# Patient Record
Sex: Female | Born: 1975 | Race: White | Hispanic: No | Marital: Married | Smoking: Current every day smoker
Health system: Southern US, Community
[De-identification: ages and names within clinical notes are randomized; demographics above are authoritative.]

## PROBLEM LIST (undated history)

## (undated) DIAGNOSIS — J449 Chronic obstructive pulmonary disease, unspecified: Secondary | ICD-10-CM

## (undated) DIAGNOSIS — K219 Gastro-esophageal reflux disease without esophagitis: Secondary | ICD-10-CM

---

## 1997-04-25 ENCOUNTER — Other Ambulatory Visit: Admission: RE | Admit: 1997-04-25 | Discharge: 1997-04-25 | Payer: Self-pay | Admitting: Obstetrics and Gynecology

## 1997-05-11 ENCOUNTER — Inpatient Hospital Stay (HOSPITAL_COMMUNITY): Admission: RE | Admit: 1997-05-11 | Discharge: 1997-05-13 | Payer: Self-pay | Admitting: Orthopedic Surgery

## 1997-05-18 ENCOUNTER — Ambulatory Visit (HOSPITAL_COMMUNITY): Admission: RE | Admit: 1997-05-18 | Discharge: 1997-05-18 | Payer: Self-pay | Admitting: Gastroenterology

## 1997-06-12 ENCOUNTER — Other Ambulatory Visit: Admission: RE | Admit: 1997-06-12 | Discharge: 1997-06-12 | Payer: Self-pay | Admitting: Orthopedic Surgery

## 1997-06-15 ENCOUNTER — Other Ambulatory Visit: Admission: RE | Admit: 1997-06-15 | Discharge: 1997-06-15 | Payer: Self-pay | Admitting: Orthopedic Surgery

## 1997-07-25 ENCOUNTER — Emergency Department (HOSPITAL_COMMUNITY): Admission: EM | Admit: 1997-07-25 | Discharge: 1997-07-25 | Payer: Self-pay | Admitting: Emergency Medicine

## 1997-08-12 ENCOUNTER — Ambulatory Visit (HOSPITAL_COMMUNITY): Admission: RE | Admit: 1997-08-12 | Discharge: 1997-08-12 | Payer: Self-pay | Admitting: Orthopedic Surgery

## 1997-08-17 ENCOUNTER — Encounter: Admission: RE | Admit: 1997-08-17 | Discharge: 1997-11-15 | Payer: Self-pay | Admitting: Anesthesiology

## 1997-11-07 ENCOUNTER — Encounter: Payer: Self-pay | Admitting: Orthopedic Surgery

## 1997-11-07 ENCOUNTER — Inpatient Hospital Stay (HOSPITAL_COMMUNITY): Admission: RE | Admit: 1997-11-07 | Discharge: 1997-11-08 | Payer: Self-pay | Admitting: Orthopedic Surgery

## 1998-02-07 ENCOUNTER — Ambulatory Visit (HOSPITAL_BASED_OUTPATIENT_CLINIC_OR_DEPARTMENT_OTHER): Admission: RE | Admit: 1998-02-07 | Discharge: 1998-02-07 | Payer: Self-pay | Admitting: Orthopedic Surgery

## 1998-11-21 ENCOUNTER — Inpatient Hospital Stay (HOSPITAL_COMMUNITY): Admission: RE | Admit: 1998-11-21 | Discharge: 1998-11-25 | Payer: Self-pay | Admitting: Orthopedic Surgery

## 1998-11-21 ENCOUNTER — Encounter: Payer: Self-pay | Admitting: Orthopedic Surgery

## 1999-04-22 ENCOUNTER — Encounter: Payer: Self-pay | Admitting: Orthopedic Surgery

## 1999-04-22 ENCOUNTER — Ambulatory Visit (HOSPITAL_COMMUNITY): Admission: RE | Admit: 1999-04-22 | Discharge: 1999-04-22 | Payer: Self-pay | Admitting: Orthopedic Surgery

## 1999-10-15 ENCOUNTER — Inpatient Hospital Stay (HOSPITAL_COMMUNITY): Admission: RE | Admit: 1999-10-15 | Discharge: 1999-10-18 | Payer: Self-pay | Admitting: Orthopedic Surgery

## 1999-10-15 ENCOUNTER — Encounter: Payer: Self-pay | Admitting: Orthopedic Surgery

## 2000-03-26 ENCOUNTER — Ambulatory Visit (HOSPITAL_COMMUNITY): Admission: RE | Admit: 2000-03-26 | Discharge: 2000-03-26 | Payer: Self-pay | Admitting: Gastroenterology

## 2000-05-18 ENCOUNTER — Ambulatory Visit (HOSPITAL_COMMUNITY): Admission: RE | Admit: 2000-05-18 | Discharge: 2000-05-18 | Payer: Self-pay | Admitting: Gastroenterology

## 2000-07-28 ENCOUNTER — Inpatient Hospital Stay (HOSPITAL_COMMUNITY): Admission: EM | Admit: 2000-07-28 | Discharge: 2000-08-03 | Payer: Self-pay | Admitting: Psychiatry

## 2000-10-17 ENCOUNTER — Encounter: Payer: Self-pay | Admitting: Emergency Medicine

## 2000-10-18 ENCOUNTER — Inpatient Hospital Stay (HOSPITAL_COMMUNITY): Admission: EM | Admit: 2000-10-18 | Discharge: 2000-10-29 | Payer: Self-pay | Admitting: *Deleted

## 2000-11-22 ENCOUNTER — Encounter: Payer: Self-pay | Admitting: Orthopedic Surgery

## 2000-11-25 ENCOUNTER — Inpatient Hospital Stay (HOSPITAL_COMMUNITY): Admission: RE | Admit: 2000-11-25 | Discharge: 2000-11-29 | Payer: Self-pay | Admitting: Orthopedic Surgery

## 2000-11-29 ENCOUNTER — Inpatient Hospital Stay (HOSPITAL_COMMUNITY)
Admission: RE | Admit: 2000-11-29 | Discharge: 2000-12-07 | Payer: Self-pay | Admitting: Physical Medicine & Rehabilitation

## 2001-02-07 ENCOUNTER — Encounter: Payer: Self-pay | Admitting: *Deleted

## 2001-02-07 ENCOUNTER — Inpatient Hospital Stay (HOSPITAL_COMMUNITY): Admission: EM | Admit: 2001-02-07 | Discharge: 2001-02-20 | Payer: Self-pay | Admitting: Psychiatry

## 2001-02-18 ENCOUNTER — Encounter: Payer: Self-pay | Admitting: Psychiatry

## 2001-03-11 ENCOUNTER — Encounter: Payer: Self-pay | Admitting: Emergency Medicine

## 2001-03-11 ENCOUNTER — Emergency Department (HOSPITAL_COMMUNITY): Admission: EM | Admit: 2001-03-11 | Discharge: 2001-03-12 | Payer: Self-pay | Admitting: Emergency Medicine

## 2001-03-12 ENCOUNTER — Encounter: Payer: Self-pay | Admitting: Emergency Medicine

## 2001-03-25 ENCOUNTER — Ambulatory Visit (HOSPITAL_COMMUNITY): Admission: RE | Admit: 2001-03-25 | Discharge: 2001-03-25 | Payer: Self-pay | Admitting: Gastroenterology

## 2001-04-10 ENCOUNTER — Inpatient Hospital Stay (HOSPITAL_COMMUNITY): Admission: EM | Admit: 2001-04-10 | Discharge: 2001-04-26 | Payer: Self-pay | Admitting: Psychiatry

## 2001-04-24 ENCOUNTER — Encounter: Payer: Self-pay | Admitting: Emergency Medicine

## 2001-04-24 ENCOUNTER — Emergency Department (HOSPITAL_COMMUNITY): Admission: EM | Admit: 2001-04-24 | Discharge: 2001-04-25 | Payer: Self-pay | Admitting: Emergency Medicine

## 2001-05-16 ENCOUNTER — Emergency Department (HOSPITAL_COMMUNITY): Admission: EM | Admit: 2001-05-16 | Discharge: 2001-05-17 | Payer: Self-pay | Admitting: *Deleted

## 2001-06-09 ENCOUNTER — Encounter: Payer: Self-pay | Admitting: Physical Medicine & Rehabilitation

## 2001-06-09 ENCOUNTER — Ambulatory Visit (HOSPITAL_COMMUNITY)
Admission: RE | Admit: 2001-06-09 | Discharge: 2001-06-09 | Payer: Self-pay | Admitting: Physical Medicine & Rehabilitation

## 2001-09-05 ENCOUNTER — Encounter
Admission: RE | Admit: 2001-09-05 | Discharge: 2001-09-05 | Payer: Self-pay | Admitting: Physical Medicine & Rehabilitation

## 2001-11-10 ENCOUNTER — Inpatient Hospital Stay (HOSPITAL_COMMUNITY): Admission: EM | Admit: 2001-11-10 | Discharge: 2001-11-21 | Payer: Self-pay | Admitting: Psychiatry

## 2001-11-10 ENCOUNTER — Encounter: Payer: Self-pay | Admitting: Emergency Medicine

## 2001-11-17 ENCOUNTER — Encounter: Payer: Self-pay | Admitting: Psychiatry

## 2001-12-13 ENCOUNTER — Encounter
Admission: RE | Admit: 2001-12-13 | Discharge: 2002-03-13 | Payer: Self-pay | Admitting: Physical Medicine & Rehabilitation

## 2002-04-02 ENCOUNTER — Inpatient Hospital Stay (HOSPITAL_COMMUNITY): Admission: EM | Admit: 2002-04-02 | Discharge: 2002-04-14 | Payer: Self-pay | Admitting: Psychiatry

## 2002-05-22 ENCOUNTER — Encounter
Admission: RE | Admit: 2002-05-22 | Discharge: 2002-08-20 | Payer: Self-pay | Admitting: Physical Medicine & Rehabilitation

## 2002-06-13 ENCOUNTER — Encounter: Payer: Self-pay | Admitting: Physical Medicine & Rehabilitation

## 2002-06-13 ENCOUNTER — Encounter
Admission: RE | Admit: 2002-06-13 | Discharge: 2002-06-13 | Payer: Self-pay | Admitting: Physical Medicine & Rehabilitation

## 2002-06-25 ENCOUNTER — Encounter
Admission: RE | Admit: 2002-06-25 | Discharge: 2002-06-25 | Payer: Self-pay | Admitting: Physical Medicine & Rehabilitation

## 2002-06-25 ENCOUNTER — Encounter: Payer: Self-pay | Admitting: Physical Medicine & Rehabilitation

## 2002-08-14 ENCOUNTER — Emergency Department (HOSPITAL_COMMUNITY): Admission: EM | Admit: 2002-08-14 | Discharge: 2002-08-14 | Payer: Self-pay | Admitting: Emergency Medicine

## 2002-09-07 ENCOUNTER — Ambulatory Visit (HOSPITAL_BASED_OUTPATIENT_CLINIC_OR_DEPARTMENT_OTHER): Admission: RE | Admit: 2002-09-07 | Discharge: 2002-09-08 | Payer: Self-pay | Admitting: Orthopedic Surgery

## 2003-04-16 ENCOUNTER — Inpatient Hospital Stay (HOSPITAL_COMMUNITY): Admission: AD | Admit: 2003-04-16 | Discharge: 2003-04-30 | Payer: Self-pay | Admitting: Psychiatry

## 2003-05-29 ENCOUNTER — Emergency Department (HOSPITAL_COMMUNITY): Admission: EM | Admit: 2003-05-29 | Discharge: 2003-05-29 | Payer: Self-pay | Admitting: Emergency Medicine

## 2003-05-29 ENCOUNTER — Inpatient Hospital Stay (HOSPITAL_COMMUNITY): Admission: AD | Admit: 2003-05-29 | Discharge: 2003-06-06 | Payer: Self-pay | Admitting: Psychiatry

## 2003-08-30 ENCOUNTER — Emergency Department (HOSPITAL_COMMUNITY): Admission: EM | Admit: 2003-08-30 | Discharge: 2003-08-31 | Payer: Self-pay | Admitting: Emergency Medicine

## 2003-09-07 ENCOUNTER — Emergency Department (HOSPITAL_COMMUNITY): Admission: EM | Admit: 2003-09-07 | Discharge: 2003-09-07 | Payer: Self-pay | Admitting: Emergency Medicine

## 2004-01-27 ENCOUNTER — Emergency Department (HOSPITAL_COMMUNITY): Admission: EM | Admit: 2004-01-27 | Discharge: 2004-01-27 | Payer: Self-pay | Admitting: Emergency Medicine

## 2004-07-31 ENCOUNTER — Emergency Department (HOSPITAL_COMMUNITY): Admission: EM | Admit: 2004-07-31 | Discharge: 2004-07-31 | Payer: Self-pay | Admitting: Emergency Medicine

## 2005-07-04 ENCOUNTER — Emergency Department (HOSPITAL_COMMUNITY): Admission: EM | Admit: 2005-07-04 | Discharge: 2005-07-05 | Payer: Self-pay | Admitting: Emergency Medicine

## 2005-08-04 ENCOUNTER — Emergency Department (HOSPITAL_COMMUNITY): Admission: EM | Admit: 2005-08-04 | Discharge: 2005-08-05 | Payer: Self-pay | Admitting: Emergency Medicine

## 2005-08-24 ENCOUNTER — Emergency Department (HOSPITAL_COMMUNITY): Admission: EM | Admit: 2005-08-24 | Discharge: 2005-08-24 | Payer: Self-pay | Admitting: Emergency Medicine

## 2006-02-14 ENCOUNTER — Emergency Department (HOSPITAL_COMMUNITY): Admission: EM | Admit: 2006-02-14 | Discharge: 2006-02-14 | Payer: Self-pay | Admitting: Emergency Medicine

## 2007-01-06 IMAGING — CR DG KNEE COMPLETE 4+V*R*
4 series · 4 of 4 positions shown · non-contrast
Comparison: 07/31/04.

CLINICAL DATA: Stump pain, below the knee amputation. 
 RIGHT KNEE ? 4 VIEW ? 08/04/05:

[view not recorded (1 of 4)]
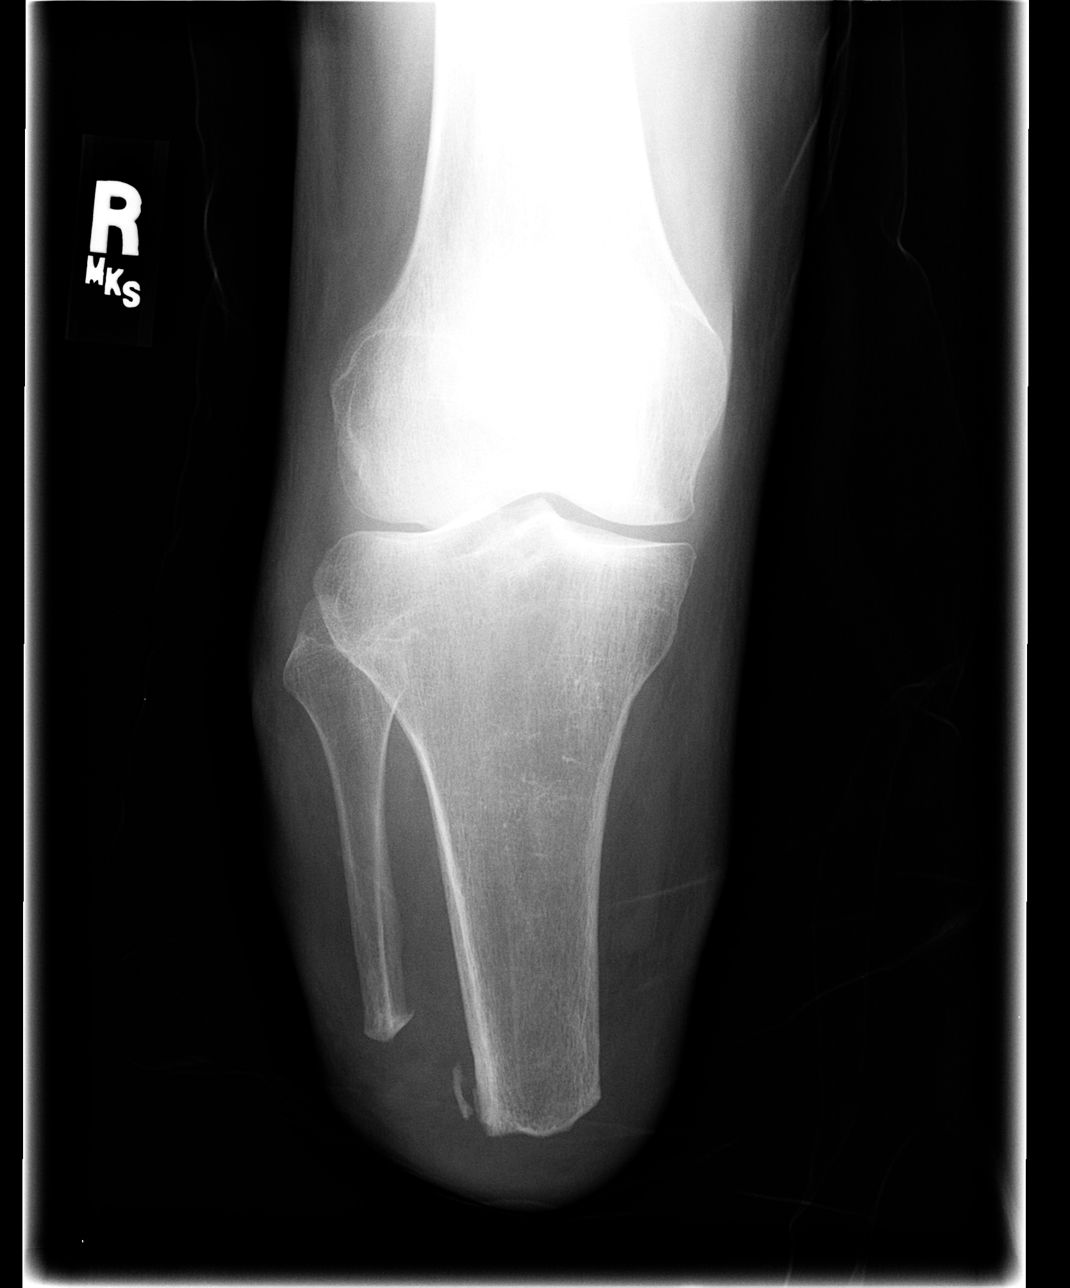

[view not recorded (2 of 4)]
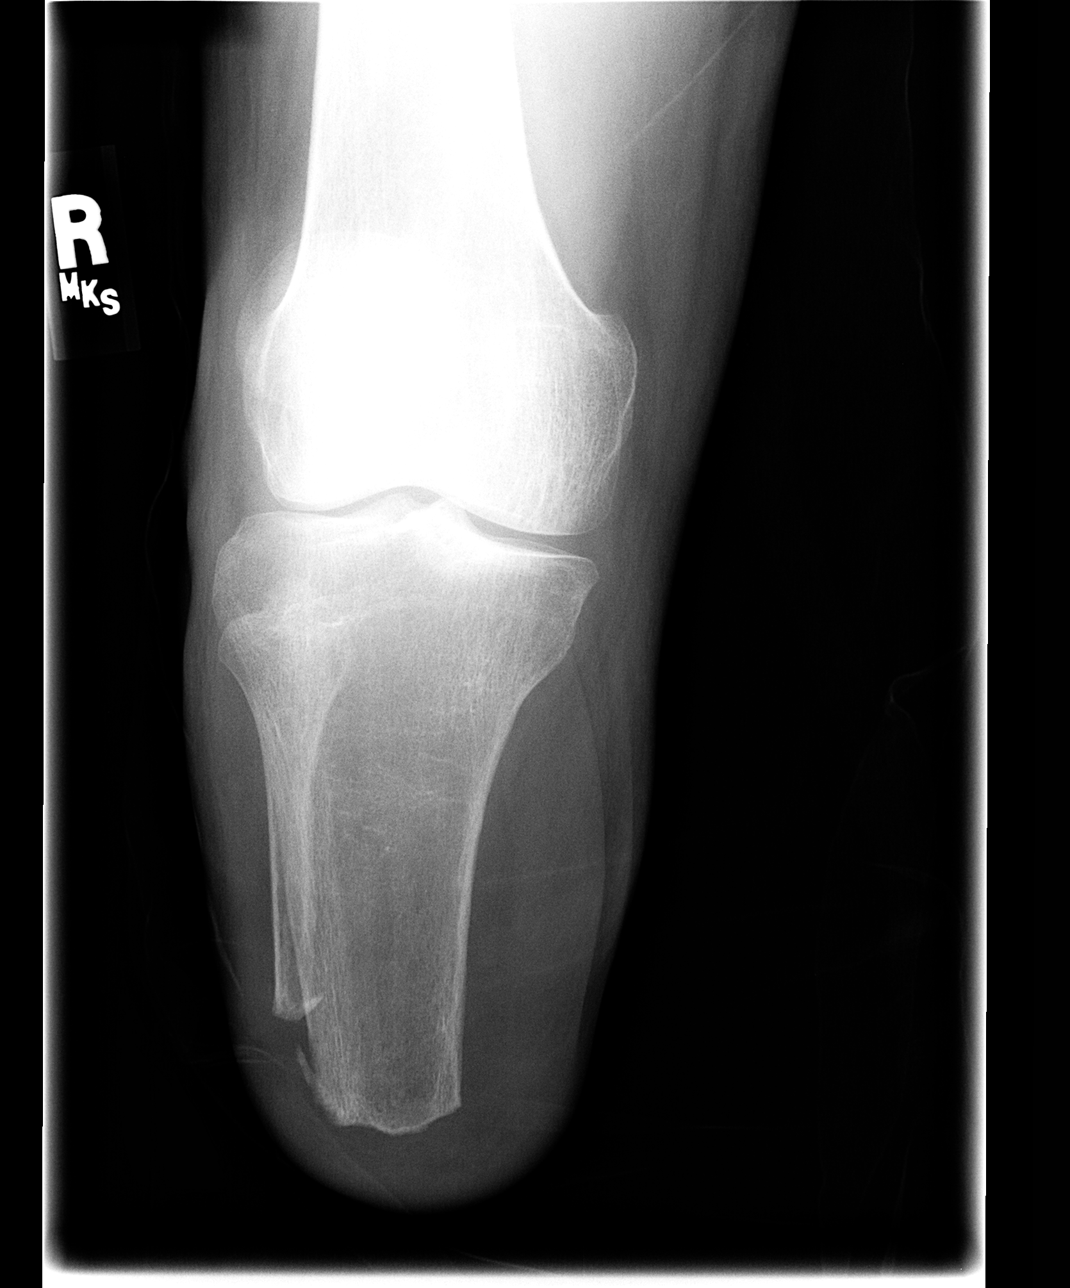

[view not recorded (3 of 4)]
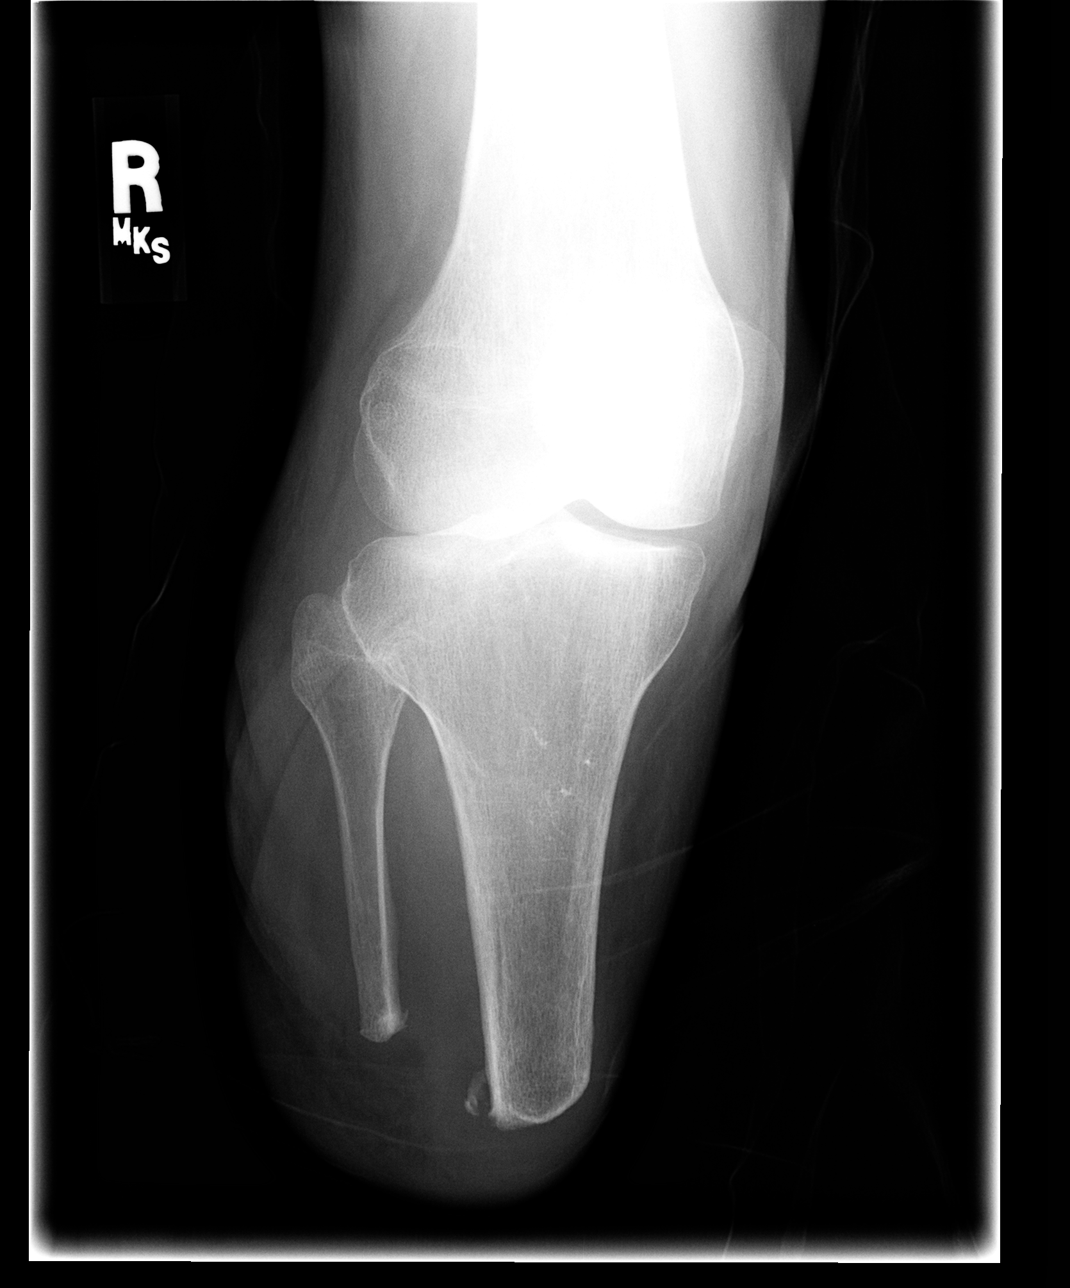

[view not recorded (4 of 4)]
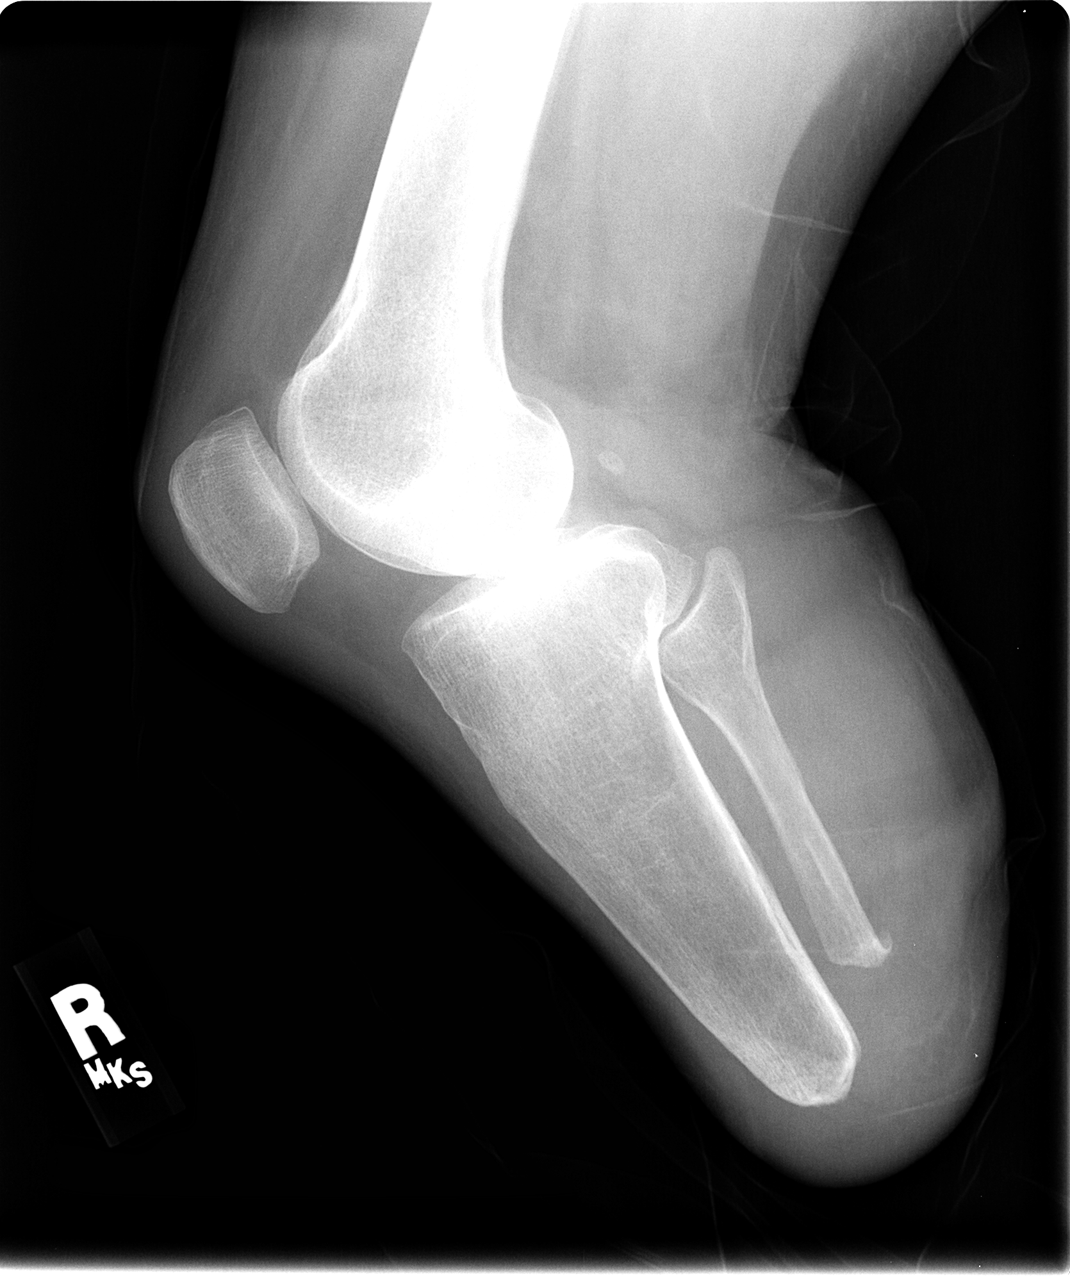

[4 of 4 positions shown; findings below may reference images not displayed]

FINDINGS: No findings of osteomyelitis are identified.  No acute bony findings.  No gas is identified within the soft tissues.  No definite knee effusion is identified on the lateral projection.
IMPRESSION: No acute radiographic findings.

## 2013-09-02 ENCOUNTER — Emergency Department (HOSPITAL_COMMUNITY)
Admission: EM | Admit: 2013-09-02 | Discharge: 2013-09-02 | Discharge status: Against Medical Advice | Payer: Medicaid Other | Attending: Emergency Medicine | Admitting: Emergency Medicine

## 2013-09-02 ENCOUNTER — Encounter (HOSPITAL_COMMUNITY): Payer: Self-pay | Admitting: Emergency Medicine

## 2013-09-02 DIAGNOSIS — R21 Rash and other nonspecific skin eruption: Secondary | ICD-10-CM | POA: Diagnosis not present

## 2013-09-02 DIAGNOSIS — Z008 Encounter for other general examination: Secondary | ICD-10-CM | POA: Insufficient documentation

## 2013-09-02 DIAGNOSIS — Z8719 Personal history of other diseases of the digestive system: Secondary | ICD-10-CM | POA: Insufficient documentation

## 2013-09-02 DIAGNOSIS — F17200 Nicotine dependence, unspecified, uncomplicated: Secondary | ICD-10-CM | POA: Diagnosis not present

## 2013-09-02 DIAGNOSIS — F1010 Alcohol abuse, uncomplicated: Secondary | ICD-10-CM | POA: Diagnosis not present

## 2013-09-02 DIAGNOSIS — Z79899 Other long term (current) drug therapy: Secondary | ICD-10-CM | POA: Diagnosis not present

## 2013-09-02 DIAGNOSIS — F1120 Opioid dependence, uncomplicated: Secondary | ICD-10-CM | POA: Diagnosis not present

## 2013-09-02 DIAGNOSIS — F1020 Alcohol dependence, uncomplicated: Secondary | ICD-10-CM

## 2013-09-02 DIAGNOSIS — J449 Chronic obstructive pulmonary disease, unspecified: Secondary | ICD-10-CM | POA: Insufficient documentation

## 2013-09-02 DIAGNOSIS — J4489 Other specified chronic obstructive pulmonary disease: Secondary | ICD-10-CM | POA: Insufficient documentation

## 2013-09-02 HISTORY — DX: Gastro-esophageal reflux disease without esophagitis: K21.9

## 2013-09-02 HISTORY — DX: Chronic obstructive pulmonary disease, unspecified: J44.9

## 2013-09-02 MED ORDER — THIAMINE HCL 100 MG/ML IJ SOLN: 100.0000 mg | Freq: Every day | INTRAMUSCULAR | Status: DC

## 2013-09-02 MED ORDER — LORAZEPAM 1 MG PO TABS: 0.0000 mg | ORAL_TABLET | Freq: Four times a day (QID) | ORAL | Status: DC

## 2013-09-02 MED ORDER — NICOTINE 21 MG/24HR TD PT24
21.0000 mg | MEDICATED_PATCH | Freq: Every day | TRANSDERMAL | Status: DC
  Filled 2013-09-02: qty 1

## 2013-09-02 MED ORDER — ZOLPIDEM TARTRATE 5 MG PO TABS: 5.0000 mg | ORAL_TABLET | Freq: Every evening | ORAL | Status: DC | PRN

## 2013-09-02 MED ORDER — LORAZEPAM 1 MG PO TABS: 0.0000 mg | ORAL_TABLET | Freq: Two times a day (BID) | ORAL | Status: DC

## 2013-09-02 MED ORDER — LORAZEPAM 2 MG/ML IJ SOLN
INTRAMUSCULAR | Status: AC
  Filled 2013-09-02: qty 1

## 2013-09-02 MED ORDER — ONDANSETRON HCL 4 MG PO TABS: 4.0000 mg | ORAL_TABLET | Freq: Three times a day (TID) | ORAL | Status: DC | PRN

## 2013-09-02 MED ORDER — VITAMIN B-1 100 MG PO TABS: 100.0000 mg | ORAL_TABLET | Freq: Every day | ORAL | Status: DC

## 2013-09-02 MED ORDER — ALUM & MAG HYDROXIDE-SIMETH 200-200-20 MG/5ML PO SUSP: 30.0000 mL | ORAL | Status: DC | PRN

## 2013-09-02 NOTE — ED Notes (Signed)
Bed: WA07 Expected date:  Expected time:  Means of arrival:  Comments: Seizure  

## 2013-09-02 NOTE — ED Notes (Signed)
Up until a day and a half ago she was taking 1 gram per day.  Experienced 1 grand mal seizure which lasted about 1 min. Witness by husband.  Pt is detoxing for heroin and ETOH.

## 2013-09-02 NOTE — ED Notes (Signed)
Pt ambulatory and irate that she did get her medication and Nicoderm patch sooner.  Pt alert and oriented but very agitated.  Pt escorted out to the waiting room after she pulled her IV out.  Pt not suicidal or homicidal.  Sts her husband is going to leave her. Pt caught up with husband after being escorted to waiting room.

## 2013-09-02 NOTE — ED Notes (Signed)
Pt is very flighty, has attempted to leave twice.

## 2013-09-03 NOTE — ED Provider Notes (Signed)
CSN: [id number]     Arrival date & time 09/02/13  1732 History   First MD Initiated Contact with Patient 09/02/13 1739     No chief complaint on file.    (Consider location/radiation/quality/duration/timing/severity/associated sxs/prior Treatment) HPI Patient presents to the emergency department following a possible seizure that lasted around 1 minute.  The husband states, that she awoke shortly after the seizure.  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  She states, that she's not had any in about 24 hours.  Patient, states, that she has not had any hallucinations, nausea, vomiting, diarrhea, weakness, dizziness, blurred vision, headache, back pain, neck pain, chest pain, shortness of breath, or syncope.  The patient, states, that nothing seems make her condition, better or worse Past Medical History  Diagnosis Date  . COPD (chronic obstructive pulmonary disease)   . GERD (gastroesophageal reflux disease)   . GERD (gastroesophageal reflux disease)    No past surgical history on file. No family history on file. History  Substance Use Topics  . Smoking status: Current Every Day Smoker  . Smokeless tobacco: Not on file  . Alcohol Use: Not on file     Comment: 1/2 of fifth per day   OB History   Grav Para Term Preterm Abortions TAB SAB Ect Mult Living                 Review of Systems  All other systems negative except as documented in the HPI. All pertinent positives and negatives as reviewed in the HPI.  Allergies  Review of patient's allergies indicates no known allergies.  Home Medications   Prior to Admission medications   Medication Sig Start Date End Date Taking? Authorizing Provider  albuterol-ipratropium (COMBIVENT) 18-103 MCG/ACT inhaler Inhale 1 puff into the lungs every 4 (four) hours.   Yes Historical Provider, MD  oxyCODONE (ROXICODONE) 15 MG immediate release tablet Take 15 mg by mouth every 4 (four) hours as needed for pain.   Yes Historical Provider, MD    BP 114/71  Temp(Src) 97.9 F (36.6 C) (Oral)  Resp 24  SpO2 96% Physical Exam  Nursing note and vitals reviewed. Constitutional: She is oriented to person, place, and time. She appears well-developed. No distress.  HENT:  Head: Normocephalic and atraumatic.  Mouth/Throat: Oropharynx is clear and moist.  Eyes: EOM are normal. Pupils are equal, round, and reactive to light.  Neck: Normal range of motion. Neck supple.  Cardiovascular: Normal rate, regular rhythm and normal heart sounds.  Exam reveals no gallop and no friction rub.   No murmur heard. Pulmonary/Chest: Effort normal and breath sounds normal.  Neurological: She is alert and oriented to person, place, and time. She exhibits normal muscle tone. Coordination normal.  Skin: Skin is warm and dry. Rash noted. No erythema.    ED Course  Procedures (including critical care time) Labs Review Labs Reviewed - No data to display  Imaging Review No results found.   EKG Interpretation   Date/Time:  Saturday September 02 2013 17:46:53 EDT Ventricular Rate:  90 PR Interval:  143 QRS Duration: 92 QT Interval:  379 QTC Calculation: 464 R Axis:   47 Text Interpretation:  Sinus rhythm Abnormal R-wave progression, early  transition No significant change since last tracing Confirmed by GOLDSTON   MD, SCOTT ([id number]) on 09/02/2013 6:37:55 PM      MDM   Final diagnoses:  None     The patient is refusing to stay for treatment of advised her that  she could die she does not stay in sig treatment.  Patient, states, we are not moving fast enough for her liking.  I again explained to the patient that her condition could worsen and could lead to her death and she still does not agree to stay for treatment.  The patient is of sound mind and answer questions appropriately.  She is alert and oriented x3.  Patient is able to walk without difficulty.  Carlyle Dollyhristopher W Dorean Daniello, PA-C 09/03/13 1615

## 2013-09-03 NOTE — ED Provider Notes (Signed)
Medical screening examination/treatment/procedure(s) were performed by non-physician practitioner and as supervising physician I was immediately available for consultation/collaboration.   EKG Interpretation   Date/Time:  Saturday September 02 2013 17:46:53 EDT Ventricular Rate:  90 PR Interval:  143 QRS Duration: 92 QT Interval:  379 QTC Calculation: 464 R Axis:   47 Text Interpretation:  Sinus rhythm Abnormal R-wave progression, early  transition No significant change since last tracing Confirmed by Laurrie Toppin   MD, Aalivia Mcgraw ([id number]) on 09/02/2013 6:37:55 PM        Audree CamelScott T Kasarah Sitts, MD 09/03/13 1731

## 2017-04-26 DEATH — deceased
